# Patient Record
Sex: Female | Born: 2000 | Race: White | Hispanic: No | Marital: Single | Smoking: Never smoker
Health system: Southern US, Community
[De-identification: ages and names within clinical notes are randomized; demographics above are authoritative.]

## PROBLEM LIST (undated history)

## (undated) DIAGNOSIS — F32A Depression, unspecified: Secondary | ICD-10-CM

## (undated) DIAGNOSIS — F909 Attention-deficit hyperactivity disorder, unspecified type: Secondary | ICD-10-CM

## (undated) HISTORY — DX: Depression, unspecified: F32.A

## (undated) HISTORY — DX: Attention-deficit hyperactivity disorder, unspecified type: F90.9

---
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
## 2023-05-29 ENCOUNTER — Emergency Department
Admission: EM | Admit: 2023-05-29 | Discharge: 2023-05-29 | Disposition: A | Discharge status: Home / Self Care | Payer: Commercial Managed Care - POS | Attending: Emergency Medicine | Admitting: Emergency Medicine

## 2023-05-29 ENCOUNTER — Emergency Department: Payer: Commercial Managed Care - POS

## 2023-05-29 DIAGNOSIS — S52502A Unspecified fracture of the lower end of left radius, initial encounter for closed fracture: Secondary | ICD-10-CM

## 2023-05-29 DIAGNOSIS — S52612A Displaced fracture of left ulna styloid process, initial encounter for closed fracture: Secondary | ICD-10-CM

## 2023-05-29 DIAGNOSIS — S52615A Nondisplaced fracture of left ulna styloid process, initial encounter for closed fracture: Secondary | ICD-10-CM | POA: Insufficient documentation

## 2023-05-29 DIAGNOSIS — W1839XA Other fall on same level, initial encounter: Secondary | ICD-10-CM | POA: Insufficient documentation

## 2023-05-29 DIAGNOSIS — S52592A Other fractures of lower end of left radius, initial encounter for closed fracture: Secondary | ICD-10-CM | POA: Insufficient documentation

## 2023-05-29 LAB — SERUM HCG, QUALITATIVE: Hcg Qualitative: NEGATIVE

## 2023-05-29 MED ORDER — PROPOFOL 10 MG/ML IV EMUL (WRAP)
40.0000 mg | Freq: Once | INTRAVENOUS | Status: AC
Start: 2023-05-29 — End: 2023-05-29
  Administered 2023-05-29: 40 mg via INTRAVENOUS

## 2023-05-29 MED ORDER — SODIUM CHLORIDE 0.9 % IV BOLUS
1000.00 mL | Freq: Once | INTRAVENOUS | Status: AC
Start: 2023-05-29 — End: 2023-05-29
  Administered 2023-05-29: 1000 mL via INTRAVENOUS

## 2023-05-29 MED ORDER — MORPHINE SULFATE 4 MG/ML IJ/IV SOLN (WRAP)
4.0000 mg | Freq: Once | Status: AC
Start: 2023-05-29 — End: 2023-05-29
  Administered 2023-05-29: 4 mg via INTRAVENOUS
  Filled 2023-05-29: qty 1

## 2023-05-29 MED ORDER — ONDANSETRON HCL 4 MG/2ML IJ SOLN
4.00 mg | Freq: Once | INTRAMUSCULAR | Status: AC
Start: 2023-05-29 — End: 2023-05-29
  Administered 2023-05-29: 4 mg via INTRAVENOUS
  Filled 2023-05-29: qty 2

## 2023-05-29 MED ORDER — KETOROLAC TROMETHAMINE 30 MG/ML IJ SOLN
15.00 mg | Freq: Once | INTRAMUSCULAR | Status: AC
Start: 2023-05-29 — End: 2023-05-29
  Administered 2023-05-29: 15 mg via INTRAVENOUS
  Filled 2023-05-29: qty 1

## 2023-05-29 MED ORDER — PROPOFOL 10 MG/ML IV EMUL (WRAP)
150.0000 mg | Freq: Once | INTRAVENOUS | Status: AC
Start: 2023-05-29 — End: 2023-05-29
  Administered 2023-05-29: 100 mg via INTRAVENOUS
  Filled 2023-05-29: qty 20

## 2023-05-29 MED ORDER — OXYCODONE HCL 5 MG PO TABS
5.0000 mg | ORAL_TABLET | Freq: Four times a day (QID) | ORAL | 0 refills | Status: AC | PRN
Start: 2023-05-29 — End: 2023-06-05

## 2023-05-29 NOTE — ED Provider Notes (Signed)
[facility]       [facility]  EMERGENCY DEPARTMENT HISTORY AND PHYSICAL EXAM    Patient Information     Patient Name: Nicole Davenport, Nicole Davenport  Encounter Date:  05/29/2023  Patient DOB:  Jul 16, 2001  MRN:  [id number]  Room:  03/A03  Rendering Provider: Charolette Forward, FNP-BC     History of Presenting Illness     Chief Complaint: left wrist pain   Historian: patient   Onset: acute  Quality: sharp   Location: left wrist    Duration: PTA       HPI Comments:   Patient is a 13y female with hx of adhd, RHD is here for left wrist pain. She was walking when she twister her ankle and landed on her left wrist. Does not have ankle pain and able to ambulate.Parents are here, did not witness fall but states they heard her screaming. Did not hit her head. No meds given PTA. Crying, in distress due to pain. Last meal was 1:30p today. No previous injury to left wrist     Translator used no  History obtained from another historian (parent, spouse,  care giver, ems) : parents    Nursing notes from this date of service were reviewed and I agree.   Nursing (triage) note reviewed for the following pertinent information:  c/o L wrist pain after a fall. denies hitting head. +pms in triage. possible deformity noted. tearful.        PMD: Warnell Bureau, MD    Past Medical History     Past Medical History:   Diagnosis Date    Attention deficit hyperactivity disorder (ADHD)     Depression      She has no past surgical history on file.      Past Surgical History     History reviewed. No pertinent surgical history.    Family History     Family History   Problem Relation Age of Onset    Bipolar disorder Paternal Grandmother        Social History     Social History     Socioeconomic History    Marital status: Single     Spouse name: Not on file    Number of children: Not on file    Years of education: Not on file    Highest education level: Not on file   Occupational History    Not on file   Tobacco Use     Smoking status: Never    Smokeless tobacco: Never   Vaping Use    Vaping status: Never Used   Substance and Sexual Activity    Alcohol use: Not on file    Drug use: Not on file    Sexual activity: Not on file   Other Topics Concern    Not on file   Social History Narrative    Not on file     Social Determinants of Health     Financial Resource Strain: Not on file   Food Insecurity: No Food Insecurity (05/29/2023)    Hunger Vital Sign     Worried About Running Out of Food in the Last Year: Never true     Ran Out of Food in the Last Year: Never true   Transportation Needs: Not on file   Physical Activity: Not on file   Stress: Not on file   Social Connections: Not on file   Intimate Partner Violence: Not At Risk (05/29/2023)    Humiliation,  Afraid, Rape, and Kick questionnaire     Fear of Current or Ex-Partner: No     Emotionally Abused: No     Physically Abused: No     Sexually Abused: No   Housing Stability: Not on file     Pediatric History   Patient Parents    Mccadden,Tammy (Mother)    Laning,William (Father)     Other Topics Concern    Not on file   Social History Narrative    Not on file     Social History     Tobacco Use    Smoking status: Never    Smokeless tobacco: Never   Substance Use Topics    Alcohol use: Not on file         Allergies     Allergies   Allergen Reactions    Peanut Oil Itching and Rash       Home Medications     Prior to Admission medications    Medication Sig Start Date End Date Taking? Authorizing Provider   amphetamine-dextroamphetamine (Adderall XR) 10 MG 24 hr capsule Take 1 capsule (10 mg total) by mouth every morning 10/03/19   Warnell Bureau, MD         Review of Systems     Please refer to HPI for ROS     Physical Exam     Patient Vitals for the past 24 hrs:   BP Temp Temp src Pulse Resp SpO2 Weight   05/29/23 1822 121/78 -- -- -- -- -- --   05/29/23 1735 125/64 98.8 F (37.1 C) Oral 100 15 100 % --   05/29/23 1730 127/59 -- -- 98 18 99 % --   05/29/23 1725 129/61 -- -- 99 16 99  % --   05/29/23 1720 125/69 98.9 F (37.2 C) Oral 99 16 99 % --   05/29/23 1715 127/71 -- -- 96 16 99 % --   05/29/23 1710 118/68 -- -- 88 16 98 % --   05/29/23 1705 139/69 -- -- 89 16 99 % --   05/29/23 1701 129/70 -- -- 82 15 98 % --   05/29/23 1656 129/70 -- -- 80 16 100 % --   05/29/23 1655 129/73 99.1 F (37.3 C) Oral (!) 108 18 100 % --   05/29/23 1645 138/78 -- -- 94 18 100 % --   05/29/23 1630 130/80 -- -- 96 17 100 % --   05/29/23 1629 130/80 -- -- (!) 101 16 100 % --   05/29/23 1605 129/80 -- -- 100 18 100 % --   05/29/23 1517 -- -- -- -- -- 99 % 90.7 kg   05/29/23 1510 138/86 -- -- 89 18 98 % --   05/29/23 1503 -- 98.4 F (36.9 C) Oral (!) 105 18 97 % --     Physical Exam  Vitals and nursing note reviewed.   Constitutional:       Appearance: Normal appearance. She is well-developed. She is not ill-appearing.   HENT:      Head: Normocephalic.      Nose: Nose normal.   Eyes:      General: Lids are normal.      Conjunctiva/sclera: Conjunctivae normal.      Pupils: Pupils are equal, round, and reactive to light.   Cardiovascular:      Rate and Rhythm: Normal rate and regular rhythm.      Pulses: Normal pulses.  Radial pulses are 2+ on the right side and 2+ on the left side.      Heart sounds: Normal heart sounds, S1 normal and S2 normal.   Pulmonary:      Effort: Pulmonary effort is normal.      Breath sounds: Normal breath sounds.   Musculoskeletal:         General: Normal range of motion.      Cervical back: Full passive range of motion without pain and normal range of motion.      Comments: Left wrist with volar deformity. Able to move thumb. Able to feel each fingers to light touch. Unable to lift against gravity. TTP to distal wrist, mid forearm and some in elbow.   No shoulder, clavicle pain.    Skin:     General: Skin is warm.      Capillary Refill: Capillary refill takes less than 2 seconds.      Findings: No bruising or rash.      Comments: No wounds, abrasion, laceration.     Neurological:      Mental Status: She is alert and oriented to person, place, and time.      GCS: GCS eye subscore is 4. GCS verbal subscore is 5. GCS motor subscore is 6.   Psychiatric:         Speech: Speech normal.         Behavior: Behavior normal. Behavior is cooperative.           Orders Placed During This Encounter     Orders Placed This Encounter   Procedures    ORTHOPEDIC INJURY TREATMENT    Sling    Wrist Left PA Lateral and Oblique    Elbow Left AP Lateral and Obliques    Wrist Left PA Lateral and Oblique    Beta HCG, Qual, Serum    Procedural sedation       ED Medications Administered     ED Medication Orders (From admission, onward)      Start Ordered     Status Ordering Provider    05/29/23 1658 05/29/23 1734  propofol (DIPRIVAN) injection 40 mg  Once        Route: Intravenous  Ordered Dose: 40 mg       Last MAR action: Given KRAPE, KYLI N    05/29/23 1636 05/29/23 1636  propofol (DIPRIVAN) injection 150 mg  Once        Route: Intravenous  Ordered Dose: 150 mg       Last MAR action: Given KRAPE, KYLI N    05/29/23 1616 05/29/23 1615  morphine injection 4 mg  Once        Route: Intravenous  Ordered Dose: 4 mg       Last MAR action: Given Chosen Garron, JUNG-AH    05/29/23 1519 05/29/23 1518  ketorolac (TORADOL) injection 15 mg  Once        Route: Intravenous  Ordered Dose: 15 mg       Last MAR action: Given Donita Newland, JUNG-AH    05/29/23 1502 05/29/23 1501  sodium chloride 0.9 % bolus 1,000 mL  Once        Route: Intravenous  Ordered Dose: 1,000 mL       Last MAR action: [employer], JOSEPH L    05/29/23 1502 05/29/23 1501  morphine injection 4 mg  Once        Route: Intravenous  Ordered Dose: 4 mg       Last  MAR action: Given CHU, JOSEPH L    05/29/23 1502 05/29/23 1501  ondansetron (ZOFRAN) injection 4 mg  Once        Route: Intravenous  Ordered Dose: 4 mg       Last MAR action: Given CHU, JOSEPH L            Diagnostic Study Results and Data Review     Vital Signs: Reviewed the patient's vital signs.    Nursing Notes: Reviewed and utilized available nursing notes.    The results of the diagnostic studies below were reviewed by the ED provider:    Labs  Results       Procedure Component Value Units Date/Time    Beta HCG, Drema [place] Serum [[id number]] Collected: 05/29/23 1527    Specimen: Blood Updated: 05/29/23 1550     Hcg Qualitative Negative            Radiologic Studies  Radiology Results (24 Hour)       Procedure Component Value Units Date/Time    Wrist Left PA Lateral and Oblique [[id number]] Collected: 05/29/23 1735    Order Status: Completed Updated: 05/29/23 1739    Narrative:      HISTORY:  Post reduction of left wrist fractures    COMPARISON: 05/29/2023    FINDINGS: 2 views of the left wrist were obtained, and the splinting  material obscures fine bony detail. There is improved alignment at the  distal radial fracture site with minimal residual displacement and slight  residual dorsal angulation. Ulnar styloid fracture is once again seen. No  new bony abnormalities are identified.      Impression:       Improved alignment at the distal left radial fracture site    Gerlene Burdock, MD  05/29/2023 5:37 PM    Wrist Left PA Lateral and Oblique [[id number]] Collected: 05/29/23 1642    Order Status: Completed Updated: 05/29/23 1646    Narrative:      HISTORY: trauma.      COMPARISON: None.    FINDINGS:     There is a fracture of the distal radius with dorsal angulation and slight  dorsal displacement. Impaction of the fracture fragments. Fracture of the  ulnar styloid as well. Soft tissue swelling about the wrist. Anatomic  alignment at the elbow.      Impression:        1. Dorsally angulated fracture of the distal radial metaphysis  2. Ulnar styloid fracture    Genelle Bal  05/29/2023 4:44 PM    Elbow Left AP Lateral and Obliques [[id number]] Collected: 05/29/23 1642    Order Status: Completed Updated: 05/29/23 1646    Narrative:      HISTORY: trauma.      COMPARISON: None.    FINDINGS:     There is a fracture of the distal  radius with dorsal angulation and slight  dorsal displacement. Impaction of the fracture fragments. Fracture of the  ulnar styloid as well. Soft tissue swelling about the wrist. Anatomic  alignment at the elbow.      Impression:        1. Dorsally angulated fracture of the distal radial metaphysis  2. Ulnar styloid fracture    Genelle Bal  05/29/2023 4:44 PM                          Abnormal results/incidental findings discussed with pt and/or family    Monitors, EKG, Procedures, Critical Care, and Splints  Cardiac Monitor Interpretation:  n/a        Orthopedic Injury    Date/Time: 05/30/2023 8:50 AM    Performed by: Charolette Forward, FNP  Authorized by: Wilmer Floor, DO  Consent: Written consent obtained.  Risks and benefits: risks, benefits and alternatives were discussed  Consent given by: parent  Patient understanding: patient states understanding of the procedure being performed  Patient consent: the patient's understanding of the procedure matches consent given  Imaging studies: imaging studies available  Patient identity confirmed: verbally with patient  Time out: Immediately prior to procedure a "time out" was called to verify the correct patient, procedure, equipment, support staff and site/side marked as required.  Injury location: wrist  Location details: left wrist  Injury type: fracture  Fracture type: distal radius and ulnar styloid  Pre-procedure neurovascular assessment: neurovascularly intact  Pre-procedure distal perfusion: normal  Pre-procedure neurological function: normal  Pre-procedure range of motion: reduced    Sedation:  Patient sedated: yes  Sedation type: moderate (conscious) sedation  Sedatives: propofol and see MAR for details  Analgesia: see MAR for details    Manipulation performed: yes  Skin traction used: yes  Skeletal traction used: no  Reduction successful: yes  X-ray confirmed reduction: yes  Immobilization: splint  Splint type: sugar tong  Supplies used:  Ortho-Glass  Post-procedure neurovascular assessment: post-procedure neurovascularly intact  Post-procedure distal perfusion: normal  Post-procedure neurological function: normal  Post-procedure range of motion: unchanged  Patient tolerance: patient tolerated the procedure well with no immediate complications  Comments: Splint applied and in proper alignment. Pain improved post splint, skin pink and warm, NVI. Education given regarding compartment syndrome and instructed to return to ER immediately. Home care instructions given regarding ice, motrin, elevation. Splint not to get wet. Close follow up with ortho, soonest available appt.              MDM and Clinical Notes     I, Charolette Forward, FNP, have been the primary provider for this patient during this ER visit.        Working Differential (not completely inclusive):   Fracture, contusion, sprain, dislocation     ASSESSMENT AND PLAN:   Patient is a 21y female RHD is here for left wrist pain. NO open wounds. + deformity. Sensation intact but states she is unable to move anything, there was slight wiggle to the thumb. Acute distress. Will give pain meds, IV and plan. Possibly need reduction and sedation. NPO since 1:30p     Diagnostic tests appropriately considered even if not ultimately performed: considered HCT, no trauma.     ED Course as of 05/30/23 0856   Fri May 29, 2023   1710 Reduction completed by me and 2nd provider Dr. Craige Cotta who performed sedation.  [JD]   1819 Patient ambulated and passed PO challenge without vomiting. Follow up with hand surgeon in 2-3 days. Splint precautions discussed. Risks of opioids discussed with pt and both parents [JD]      ED Course User Index  [JD] Emanii Bugbee, Jung-Ah, FNP       I do not suspect severe sepsis or septic shock  Was management discussed with a consultant?: N/A  Was the decision around the need for surgery discussed with consultant: Spoke with ortho on call Dr. Georges Mouse, satisfactory reduction. Will see in the  office     Acute illness/injury Severe   Chronic illness impacting care: n/a  Medical Decision Making  Amount and/or Complexity of Data Reviewed  External  Data Reviewed: radiology.  Labs: ordered.  Radiology: ordered. Decision-making details documented in ED Course.     Details: Visualized and interpreted x ray, displaced fracture of distal radius and ulnar styloid     Risk  OTC drugs.  Prescription drug management.  Parenteral controlled substances.  Drug therapy requiring intensive monitoring for toxicity.  Minor surgery with no identified risk factors.        Evaluation   External Records Reviewed?: [place] [facility] Reviewed, and no concerning prescriptions noted.  09/20/2019 PCP note for ADD         Discussed results and diagnosis with parents at the bedside.    The parents is aware that todays emergency department evaluation has limitations and is only a screening that can be falsely reassuring and symptoms can be progressive requiring reevaluation. Reviewed warning signs for worsening condition and to return to emergency department immediately.   Patient to follow up with PCP/ specialist listed on follow up in 2 days.   Patient expressed understanding of instructions.         Prescriptions       Discharge Medication List as of 05/29/2023  6:20 PM        START taking these medications    Details   oxyCODONE (ROXICODONE) 5 MG immediate release tablet Take 1 tablet (5 mg) by mouth every 6 (six) hours as needed for Pain, Starting Fri 05/29/2023, Until Fri 06/05/2023 at 2359, [facility]             Diagnosis and Disposition     Clinical Impression:  1. Closed fracture of distal end of left radius, unspecified fracture morphology, initial encounter    2. Closed displaced fracture of styloid process of left ulna, initial encounter          Disposition:  ED Disposition       ED Disposition   Discharge    Condition   --    Date/Time   Fri May 29, 2023  6:19 PM    Comment   [place] Den[place] discharge to home/self  care.    Condition at disposition: Stable                 Discharge Vitals  Visit Vitals  BP 121/78   Pulse 100   Temp 98.8 F (37.1 C) (Oral)   Resp 15   Wt 90.7 kg   SpO2 100%       Rendering Provider: Charolette Forward, FNP-BC     Attending's signature signifies review of the provider note and clinical impression.     Charolette Forward, FNP  05/30/23 0858       Charolette Forward, FNP  05/30/23 5409       Domingo Pulse, DO  05/30/23 1428

## 2023-05-29 NOTE — ED Triage Notes (Signed)
Pt s/p fall injury to left wrist. Denies head strike/lob. +deformity noted.

## 2023-05-29 NOTE — Discharge Instructions (Addendum)
Rest, Ice and Elevate, Advil and tylenol for pain.   Ice for 15 minutes 3-4 times daily.   Follow up with ortho ideally within 3 days.   No sports or PE until cleared by ortho.       Take oxycodone for breakthrough pain when tylenol and motrin is not helpful or it is too early to take your next dose.  Oxycodone is a narcotic. Can cause risk of slow/stopped breathing, addiction, drowsiness/dizziness.   Do not drive or drink alcohol while you are taking a narcotic.   Take 1 capful of miralax once daily as long as you are taking oxycodone.             Motrin 600mg  every 6-8 hours as needed for pain.   Tylenol 1000mg  every 4-6 hours as needed for pain.     Do not take the splint off or get it wet.    If any severe pain, blue/cold fingers, numbness return to ER or urgent care immediately to look at splint.     Dear Nicole Davenport,    You were seen today by Charolette Forward, FNP. Thank you for choosing the [facility] Emergency Department for your healthcare needs.  We hope your visit today was EXCELLENT.      Please take any medications prescribed as directed.   Return to the emergency department for any new or worsening symptoms.    If you have any questions or concerns, I am available at [phone]. Please do not hesitate to contact me if I can be of assistance.     Below is some information and resources that our patients often find helpful.    Sincerely,    Charolette Forward, FNP  [facility] - Department of Emergency Medicine    ________________________________________________________________      Thank you for choosing [facility] for your emergency care needs.  We strive to provide EXCELLENT care to you and your family.      DOCTOR REFERRALS  Call [phone] if you need any further referrals and we can help you find a primary care doctor or specialist.  Also, available online at:  [email or web address]    YOUR CONTACT INFORMATION  Before leaving please check  with registration to make sure we have an up-to-date contact number.  You can call registration at [phone] to update your information.  For questions about your hospital bill, please call [phone].  For questions about your Emergency Dept Physician bill please call [phone].      FREE HEALTH SERVICES  If you need help with health or social services, please call 2-1-1 for a free referral to resources in your area.  2-1-1 is a free service connecting people with information on health insurance, free clinics, pregnancy, mental health, dental care, food assistance, housing, and substance abuse counseling.  Also, available online at:  http://[email or web address].org    MEDICAL RECORDS AND TESTS  Certain laboratory test results do not come back the same day, for example urine cultures.  We will contact you if other important findings are noted.  Radiology films are often reviewed again to ensure accuracy.  If there is any discrepancy, we will notify you.      Please call [phone] to pick up a complimentary CD of any radiology studies performed.  If you or your doctor would like to request a copy of your medical records, please call [phone].      ORTHOPEDIC INJURY  Please know that significant injuries can exist even when an initial x-ray is read as normal or negative.  This can occur because some fractures (broken bones) are not initially visible on x-rays.  For this reason, close outpatient follow-up with your primary care doctor or bone specialist (orthopedist) is required.    MEDICATIONS AND FOLLOWUP  Please be aware that some prescription medications can cause drowsiness.  Use caution when driving or operating machinery.    The examination and treatment you have received in our Emergency Department is provided on an emergency basis, and is not intended to be a substitute for your primary care physician.  It is important that your doctor checks you again and that you report any new or  remaining problems at that time.      LOCAL PHARMACIES  [facility] - [address] [place], [place] [address] (1.4 miles, 7 minutes)  [facility] - [address], [place], [place] [address] (6.5 miles, 13 minutes)  Handout with directions available on request    PATIENT RELATIONS  If you have any concerns, issues, or feedback related to your care, positive or negative, please do not hesitate to contact Patient Relations at [phone]. They are open from 8:30AM-5:00PM Monday through Friday.

## 2023-05-29 NOTE — ED Notes (Signed)
Sling applied, demonstrated to pt how to apply. Pt and parents verbalized understanding. Pt ambulated with steady gait with sling.

## 2023-05-29 NOTE — ED Provider Notes (Signed)
1615.  Met with patient introduced myself to patient as well patient's mother and father both sitting bedside.  22 year old female being seen primarily by [facility], NP for left wrist fracture, FOOSH type mechanism injury.  She is right-hand dominant.  Neurologically intact on the left side, cap refill less than 2 seconds, sensation intact to all digits.  She is obvious deformity of the left wrist.  Last p.o. intake 1315 p.m. today.  She has no known chronic medical conditions takes only oral contraceptives at baseline.  She reports she previously had wisdom teeth surgery no adverse reactions to sedation/anesthesia at that time.    Vitals:    05/29/23 1605   BP: 129/80   Pulse: 100   Resp: 18   Temp:    SpO2: 100%     Wrist Left PA Lateral and Oblique    (Results Pending)   Elbow Left AP Lateral and Obliques    (Results Pending)     I have personally reviewed the above imaging and agree with the radiologist interpretation unless otherwise explicitly noted.    Procedural sedation    Date/Time: 05/29/2023 4:25 PM    Performed by: Wilmer Floor, DO  Authorized by: Wilmer Floor, DO  See nursing sedation notes    Pre-Sedation  written consent obtained  See ed record for detailed H&P    History  Last P.O. Intake: 05/29/2023 1:15 PM  ASA classification: class 1 - normal, healthy patient    Physical  Mallampati score: I - soft palate, uvula, fauces, pillars visible  Specific sedation plan: see orders  History, physical, and sedation plan done prior to performing sedation    Pre-sedation completed at: 05/29/2023 5:00 PM  Sedation  Immediately prior to the sedation the patient was reassessed:  Vital signs reviewed  Patient status has not changedPatient identity confirmed via: arm band, provided demographic data, hospital-assigned identification number and verbally with the patient  Immediately prior to the procedure a time out was called  Procedure performed: fracture reduction  Procedure performed by: another provider with my  supervision  Other provider: Ellamae Sia  Sedation agents used: propofol (140mg )  Duration of Sedation:   I was continuously present at the bedside following the administration of the first dose of sedation medication for (minutes): 25  See RN note for full timeline  Post-sedation  I have examined the patient and reviewed the chart for: respiratory function, cardiovascular function, mental status, temperature, pain, nausea/vomiting, and post-operative hydration to determine recovery from anesthesia and the presence of complications: yes  Patient was reassessed following the procedure and reached pre-procedure baseline: yes  Patient tolerance: patient tolerated the procedure well with no immediate complications                Wilmer Floor, DO  05/29/23 2034
